# Patient Record
Sex: Male | Born: 1985 | Race: Black or African American | Hispanic: No | Marital: Single | Smoking: Current every day smoker
Health system: Southern US, Community
[De-identification: ages and names within clinical notes are randomized; demographics above are authoritative.]

## PROBLEM LIST (undated history)

## (undated) DIAGNOSIS — J45909 Unspecified asthma, uncomplicated: Secondary | ICD-10-CM

---

## 2007-03-13 ENCOUNTER — Emergency Department (HOSPITAL_COMMUNITY): Admission: EM | Admit: 2007-03-13 | Discharge: 2007-03-14 | Payer: Self-pay | Admitting: Emergency Medicine
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
## 2011-04-30 LAB — RAPID URINE DRUG SCREEN, HOSP PERFORMED
Amphetamines: NOT DETECTED
Cocaine: POSITIVE — AB
Opiates: NOT DETECTED
Tetrahydrocannabinol: NOT DETECTED

## 2011-04-30 LAB — I-STAT 8, (EC8 V) (CONVERTED LAB)
BUN: 11
Bicarbonate: 28.7 — ABNORMAL HIGH
Glucose, Bld: 91
Operator id: 151321
pCO2, Ven: 49.5

## 2011-04-30 LAB — DIFFERENTIAL
Basophils Absolute: 0
Basophils Relative: 0
Eosinophils Relative: 6 — ABNORMAL HIGH
Monocytes Absolute: 0.8 — ABNORMAL HIGH
Neutro Abs: 4.1

## 2011-04-30 LAB — CBC
Hemoglobin: 14.7
MCHC: 34.2
RDW: 13

## 2019-08-02 ENCOUNTER — Ambulatory Visit: Payer: Self-pay | Attending: Internal Medicine

## 2019-08-02 ENCOUNTER — Other Ambulatory Visit: Payer: Self-pay

## 2019-08-02 DIAGNOSIS — Z20822 Contact with and (suspected) exposure to covid-19: Secondary | ICD-10-CM | POA: Insufficient documentation

## 2019-08-03 LAB — NOVEL CORONAVIRUS, NAA: SARS-CoV-2, NAA: NOT DETECTED

## 2019-08-07 ENCOUNTER — Telehealth: Payer: Self-pay | Admitting: General Practice

## 2019-08-07 NOTE — Telephone Encounter (Signed)
Pt calling to get COVID results.  Made him aware they are negative.

## 2019-12-25 ENCOUNTER — Encounter (HOSPITAL_COMMUNITY): Payer: Self-pay

## 2019-12-25 ENCOUNTER — Emergency Department (HOSPITAL_COMMUNITY)
Admission: EM | Admit: 2019-12-25 | Discharge: 2019-12-25 | Disposition: A | Discharge status: Home / Self Care | Payer: Self-pay | Attending: Emergency Medicine | Admitting: Emergency Medicine

## 2019-12-25 ENCOUNTER — Other Ambulatory Visit: Payer: Self-pay

## 2019-12-25 DIAGNOSIS — Y9289 Other specified places as the place of occurrence of the external cause: Secondary | ICD-10-CM | POA: Insufficient documentation

## 2019-12-25 DIAGNOSIS — Y9389 Activity, other specified: Secondary | ICD-10-CM | POA: Insufficient documentation

## 2019-12-25 DIAGNOSIS — Y999 Unspecified external cause status: Secondary | ICD-10-CM | POA: Insufficient documentation

## 2019-12-25 DIAGNOSIS — S6991XA Unspecified injury of right wrist, hand and finger(s), initial encounter: Secondary | ICD-10-CM | POA: Insufficient documentation

## 2019-12-25 DIAGNOSIS — Z5321 Procedure and treatment not carried out due to patient leaving prior to being seen by health care provider: Secondary | ICD-10-CM | POA: Insufficient documentation

## 2019-12-25 DIAGNOSIS — S6990XA Unspecified injury of unspecified wrist, hand and finger(s), initial encounter: Secondary | ICD-10-CM

## 2019-12-25 DIAGNOSIS — W28XXXA Contact with powered lawn mower, initial encounter: Secondary | ICD-10-CM | POA: Insufficient documentation
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
## 2019-12-25 NOTE — ED Triage Notes (Signed)
Pt reports was loading a lawnmower and the ramp slipped and lawnmower came down on pt's r hand.  Pt appears to have an open fracture to r little finger and laceration to r ring finger.  Bleeding controlled.

## 2021-04-07 ENCOUNTER — Emergency Department (HOSPITAL_COMMUNITY)
Admission: EM | Admit: 2021-04-07 | Discharge: 2021-04-07 | Disposition: A | Discharge status: Home / Self Care | Payer: Self-pay | Attending: Emergency Medicine | Admitting: Emergency Medicine

## 2021-04-07 ENCOUNTER — Emergency Department (HOSPITAL_COMMUNITY): Payer: Self-pay

## 2021-04-07 ENCOUNTER — Other Ambulatory Visit: Payer: Self-pay

## 2021-04-07 ENCOUNTER — Encounter (HOSPITAL_COMMUNITY): Payer: Self-pay | Admitting: Emergency Medicine

## 2021-04-07 DIAGNOSIS — R072 Precordial pain: Secondary | ICD-10-CM | POA: Insufficient documentation

## 2021-04-07 DIAGNOSIS — R5383 Other fatigue: Secondary | ICD-10-CM | POA: Insufficient documentation

## 2021-04-07 DIAGNOSIS — R079 Chest pain, unspecified: Secondary | ICD-10-CM

## 2021-04-07 DIAGNOSIS — R519 Headache, unspecified: Secondary | ICD-10-CM | POA: Insufficient documentation

## 2021-04-07 DIAGNOSIS — R110 Nausea: Secondary | ICD-10-CM | POA: Insufficient documentation

## 2021-04-07 LAB — HEPATIC FUNCTION PANEL
ALT: 16 U/L (ref 0–44)
AST: 21 U/L (ref 15–41)
Albumin: 4.9 g/dL (ref 3.5–5.0)
Alkaline Phosphatase: 55 U/L (ref 38–126)
Bilirubin, Direct: <0.1 mg/dL (ref 0.0–0.2)
Total Bilirubin: 0.4 mg/dL (ref 0.3–1.2)
Total Protein: 7.8 g/dL (ref 6.5–8.1)

## 2021-04-07 LAB — BASIC METABOLIC PANEL
Anion gap: 12 (ref 5–15)
BUN: 9 mg/dL (ref 6–20)
CO2: 25 mmol/L (ref 22–32)
Calcium: 8.9 mg/dL (ref 8.9–10.3)
Chloride: 102 mmol/L (ref 98–111)
Creatinine, Ser: 0.87 mg/dL (ref 0.61–1.24)
GFR, Estimated: >60 mL/min (ref >60)
Glucose, Bld: 97 mg/dL (ref 70–99)
Potassium: 4.1 mmol/L (ref 3.5–5.1)
Sodium: 139 mmol/L (ref 135–145)

## 2021-04-07 LAB — URINALYSIS, ROUTINE W REFLEX MICROSCOPIC
Bilirubin Urine: NEGATIVE
Glucose, UA: NEGATIVE mg/dL
Hgb urine dipstick: NEGATIVE
Ketones, ur: NEGATIVE mg/dL
Leukocytes,Ua: NEGATIVE
Nitrite: NEGATIVE
Protein, ur: NEGATIVE mg/dL
Specific Gravity, Urine: <1.005 — ABNORMAL LOW (ref 1.005–1.030)
pH: 6 (ref 5.0–8.0)

## 2021-04-07 LAB — TROPONIN I (HIGH SENSITIVITY)
Troponin I (High Sensitivity): <2 ng/L (ref <18)
Troponin I (High Sensitivity): 2 ng/L (ref <18)

## 2021-04-07 LAB — CBC
HCT: 46 % (ref 39.0–52.0)
Hemoglobin: 15 g/dL (ref 13.0–17.0)
MCH: 28.2 pg (ref 26.0–34.0)
MCHC: 32.6 g/dL (ref 30.0–36.0)
MCV: 86.6 fL (ref 80.0–100.0)
Platelets: 394 10*3/uL (ref 150–400)
RBC: 5.31 MIL/uL (ref 4.22–5.81)
RDW: 13.3 % (ref 11.5–15.5)
WBC: 10.7 10*3/uL — ABNORMAL HIGH (ref 4.0–10.5)
nRBC: 0 % (ref 0.0–0.2)

## 2021-04-07 LAB — CK: Total CK: 417 U/L — ABNORMAL HIGH (ref 49–397)

## 2021-04-07 MED ORDER — IBUPROFEN 200 MG PO TABS
600.0000 mg | ORAL_TABLET | Freq: Once | ORAL | Status: AC
  Administered 2021-04-07: 600 mg via ORAL
  Filled 2021-04-07: qty 3

## 2021-04-07 MED ORDER — DIPHENHYDRAMINE HCL 25 MG PO CAPS
25.0000 mg | ORAL_CAPSULE | Freq: Once | ORAL | Status: AC
  Administered 2021-04-07: 25 mg via ORAL
  Filled 2021-04-07: qty 1

## 2021-04-07 MED ORDER — IOHEXOL 350 MG/ML SOLN
80.0000 mL | Freq: Once | INTRAVENOUS | Status: AC | PRN
  Administered 2021-04-07: 80 mL via INTRAVENOUS

## 2021-04-07 MED ORDER — LACTATED RINGERS IV BOLUS
1000.0000 mL | Freq: Once | INTRAVENOUS | Status: AC
  Administered 2021-04-07: 1000 mL via INTRAVENOUS

## 2021-04-07 MED ORDER — MORPHINE SULFATE (PF) 4 MG/ML IV SOLN
4.0000 mg | Freq: Once | INTRAVENOUS | Status: AC
  Administered 2021-04-07: 4 mg via INTRAVENOUS
  Filled 2021-04-07: qty 1

## 2021-04-07 NOTE — ED Notes (Signed)
Started oral challenge with juice, crackers, and applesauce.

## 2021-04-07 NOTE — ED Notes (Signed)
No answer

## 2021-04-07 NOTE — ED Triage Notes (Signed)
Patient drunk alcohol tonight and ate fish sticks. He is complaining of chest pain. Patient also chased all of this with Pepto bismol.

## 2021-04-07 NOTE — ED Notes (Signed)
Pt states he will provide a urine sample when he returns from imaging.

## 2021-04-07 NOTE — ED Provider Notes (Signed)
[facility]-EMERGENCY DEPT Provider Note   CSN: [id number] Arrival date & time: 04/07/21  1884     History Chief Complaint  Patient presents with   Chest Pain    Gerald Frank is a 35 y.o. male.   Chest Pain Associated symptoms: fatigue, headache and nausea   Associated symptoms: no abdominal pain, no back pain, no cough, no dizziness, no fever, no numbness, no palpitations, no shortness of breath, no vomiting and no weakness   Patient presents for full body aches and chest pain.  He denies any chronic medical conditions.  He was in his normal state of health last night.  Around midnight, he was at home and ate frozen fish sticks and drank a beer.  He reports that shortly after this, he developed diffuse body pains.  He endorses substernal chest pain with pleurisy.  He does report the pain radiates into the right side of his neck.  He has felt nausea.  He continued to have the symptoms throughout the night.  He took a Tylenol arthritis over-the-counter medication with minimal relief.  He denies any history of similar symptoms in the past.  He has no known allergies.  He is not aware of any other exposures he may have had.  He does not have a personal or family history of early ACS. HPI: A 35 year old patient presents for evaluation of chest pain. Initial onset of pain was less than one hour ago. The patient's chest pain is described as heaviness/pressure/tightness and is not worse with exertion. The patient complains of nausea. The patient's chest pain is middle- or left-sided, is not well-localized, is not sharp and does radiate to the arms/jaw/neck. The patient denies diaphoresis. The patient has smoked in the past 90 days. The patient has no history of stroke, has no history of peripheral artery disease, denies any history of treated diabetes, has no relevant family history of coronary artery disease (first degree relative at less than age 29), is not hypertensive, has no  history of hypercholesterolemia and does not have an elevated BMI (>=30).   History reviewed. No pertinent past medical history.  There are no problems to display for this patient.   History reviewed. No pertinent surgical history.     History reviewed. No pertinent family history.  Social History   Tobacco Use   Smoking status: Never   Smokeless tobacco: Never  Substance Use Topics   Alcohol use: Yes   Drug use: Never    Home Medications Prior to Admission medications   Not on File    Allergies    Patient has no known allergies.  Review of Systems   Review of Systems  Constitutional:  Positive for fatigue. Negative for activity change, appetite change, chills and fever.  HENT:  Negative for congestion, ear pain and sore throat.   Eyes:  Negative for pain and visual disturbance.  Respiratory:  Positive for chest tightness. Negative for cough and shortness of breath.   Cardiovascular:  Positive for chest pain. Negative for palpitations.  Gastrointestinal:  Positive for nausea. Negative for abdominal pain and vomiting.  Genitourinary:  Negative for dysuria, flank pain and hematuria.  Musculoskeletal:  Positive for joint swelling and myalgias. Negative for arthralgias, back pain and neck stiffness.  Skin:  Negative for color change and rash.  Neurological:  Positive for headaches. Negative for dizziness, seizures, syncope, weakness and numbness.  All other systems reviewed and are negative.  Physical Exam Updated Vital Signs BP (!) 161/94  Pulse (!) 58   Temp 97.6 F (36.4 C) (Oral)   Resp 18   Ht 6\' 1"  (1.854 m)   Wt 68 kg   SpO2 100%   BMI 19.79 kg/m   Physical Exam Vitals and nursing note reviewed.  Constitutional:      General: He is not in acute distress.    Appearance: He is well-developed and normal weight. He is not ill-appearing, toxic-appearing or diaphoretic.  HENT:     Head: Normocephalic and atraumatic.  Eyes:     Extraocular Movements:  Extraocular movements intact.     Conjunctiva/sclera: Conjunctivae normal.  Neck:     Vascular: No JVD.  Cardiovascular:     Rate and Rhythm: Normal rate and regular rhythm.     Heart sounds: No murmur heard. Pulmonary:     Effort: Pulmonary effort is normal. No tachypnea or respiratory distress.     Breath sounds: Normal breath sounds. No decreased breath sounds or wheezing.  Chest:     Chest wall: Tenderness present.  Abdominal:     Palpations: Abdomen is soft.     Tenderness: There is no abdominal tenderness.  Musculoskeletal:     Cervical back: Neck supple.     Right lower leg: No edema.     Left lower leg: No edema.  Skin:    General: Skin is warm and dry.     Capillary Refill: Capillary refill takes less than 2 seconds.  Neurological:     General: No focal deficit present.     Mental Status: He is alert and oriented to person, place, and time.     Cranial Nerves: No cranial nerve deficit.     Motor: No weakness.  Psychiatric:        Mood and Affect: Mood normal.        Behavior: Behavior normal.    ED Results / Procedures / Treatments   Labs (all labs ordered are listed, but only abnormal results are displayed) Labs Reviewed  CBC - Abnormal; Notable for the following components:      Result Value   WBC 10.7 (*)    All other components within normal limits  CK - Abnormal; Notable for the following components:   Total CK 417 (*)    All other components within normal limits  URINALYSIS, ROUTINE W REFLEX MICROSCOPIC - Abnormal; Notable for the following components:   Specific Gravity, Urine <1.005 (*)    All other components within normal limits  BASIC METABOLIC PANEL  HEPATIC FUNCTION PANEL  TROPONIN I (HIGH SENSITIVITY)  TROPONIN I (HIGH SENSITIVITY)    EKG EKG Interpretation  Date/Time:  Tuesday April 07 2021 02:29:01 EDT Ventricular Rate:  84 PR Interval:  168 QRS Duration: 83 QT Interval:  375 QTC Calculation: 444 R Axis:   93 Text  Interpretation: Sinus rhythm Anterior infarct, old ST elevation, consider inferior injury Confirmed by 11[birth year]18-1978 [phone]) on 04/07/2021 8:00:44 AM  Radiology DG Chest 2 View  Result Date: 04/07/2021 CLINICAL DATA:  Chest pain. EXAM: CHEST - 2 VIEW COMPARISON:  Chest radiograph dated 03/13/2007. FINDINGS: The heart size and mediastinal contours are within normal limits. Both lungs are clear. The visualized skeletal structures are unremarkable. IMPRESSION: No active cardiopulmonary disease. Electronically Signed   By: 03/15/2007 M.D.   On: 04/07/2021 02:38   CT Angio Chest PE W and/or Wo Contrast  Result Date: 04/07/2021 CLINICAL DATA:  Sudden onset of chest pain yesterday. Evaluate for pulmonary embolism. EXAM: CT ANGIOGRAPHY CHEST  WITH CONTRAST TECHNIQUE: Multidetector CT imaging of the chest was performed using the standard protocol during bolus administration of intravenous contrast. Multiplanar CT image reconstructions and MIPs were obtained to evaluate the vascular anatomy. CONTRAST:  94mL OMNIPAQUE IOHEXOL 350 MG/ML SOLN COMPARISON:  None. FINDINGS: Vascular Findings: There is adequate opacification of the pulmonary arterial system with the main pulmonary artery measuring 408 Hounsfield units. There are no discrete filling defects within the pulmonary arterial tree to suggest pulmonary embolism. Normal caliber of the main pulmonary artery. Normal heart size.  No pericardial effusion. No evidence of thoracic aortic aneurysm or dissection on this nongated examination. Conventional configuration of the aortic arch. The branch vessels of the aortic arch appear patent throughout their imaged courses. Incidental note made of an aortic nipple. Review of the MIP images confirms the above findings. ---------------------------------------------------------------------------------- Nonvascular Findings: Mediastinum/Lymph Nodes: There is a minimal amount of ill-defined soft tissue within the anterior  mediastinum without associated mass effect, favored to represent residual thymic tissue. No bulky mediastinal, hilar or axillary lymphadenopathy. Lungs/Pleura: Mild apical predominant paraseptal emphysematous change, most conspicuous greater involving the medial aspect of the bilateral lung apices. Minimal bibasilar dependent subpleural ground-glass atelectasis. No discrete focal airspace opacities. No air bronchograms. No pleural effusion or pneumothorax. The central pulmonary airways appear widely patent. No discrete pulmonary nodules. Upper abdomen: Limited visualization of the upper abdomen demonstrates early excretion of contrast within the imaged portions of the bilateral renal collecting systems. Questionable mild circumferential wall thickening of the esophagus. Musculoskeletal: No acute or aggressive osseous abnormalities. Mild asymmetric left-sided gynecomastia. Normal appearance of the thyroid gland. IMPRESSION: 1. No acute cardiopulmonary disease. Specifically, no evidence of pulmonary embolism. 2. Minimal age advanced biapical paraseptal emphysematous change. 3. Potential circumferential wall thickening of the esophagus, nonspecific though could be seen in the setting of an esophagitis. Clinical correlation is advised. Electronically Signed   By: Simonne Come M.D.   On: 04/07/2021 09:50    Procedures Procedures   Medications Ordered in ED Medications  ibuprofen (ADVIL) tablet 600 mg (600 mg Oral Given 04/07/21 0847)  diphenhydrAMINE (BENADRYL) capsule 25 mg (25 mg Oral Given 04/07/21 0847)  lactated ringers bolus 1,000 mL (1,000 mLs Intravenous New Bag/Given 04/07/21 0853)  morphine 4 MG/ML injection 4 mg (4 mg Intravenous Given 04/07/21 0849)  iohexol (OMNIPAQUE) 350 MG/ML injection 80 mL (80 mLs Intravenous Contrast Given 04/07/21 0916)    ED Course  I have reviewed the triage vital signs and the nursing notes.  Pertinent labs & imaging results that were available during my care of the  patient were reviewed by me and considered in my medical decision making (see chart for details).    MDM Rules/Calculators/A&P HEAR Score: 4                         Patient is a 35 year old male with no past medical history presenting for the cute onset of full body pains described as in his muscles and joints.  He reports that his pain is most prominent in the substernal area of his chest.  Prior to being bedded in the ED, diagnostic work-up was initiated.  EKG shows anterior Q waves.  Initial troponin was normal.  Based on symptoms, EKG, and risk factors, patient is a heart score of 4.  Additional labs show normal electrolytes, normal creatinine, normal hemoglobin with a very mild leukocytosis.  On initial assessment, patient still very symptomatic.  Lungs appear to auscultation.  No cardiac  murmurs appreciated.  He has mild tenderness diffusely.  He has no focal neurologic deficits.  Additional lab work, LFTs, urinalysis, and CK were ordered.  Patient was given IV fluids and medication for symptomatic relief.  A CT scan of chest was ordered which did not show any PE.  It did show findings that could possibly be consistent with esophagitis.  It is unclear why this patient would have esophagitis.  Additional findings on work-up showed a mildly elevated CK.  Patient did not have any evidence of myoglobinuria.  Again, because of elevated CK is indeterminate at this time.  On further reassessment patient reported resolution of symptoms.  He was able to eat and drink in the ED.  He was informed of diagnostic work-up findings.  At this point, he did feel comfortable with discharge home.  He was advised to establish a primary care doctor for follow-up and to return to the ED for any recurrence of symptoms.  He was discharged in good condition. Final Clinical Impression(s) / ED Diagnoses Final diagnoses:  Chest pain, unspecified type    Rx / DC Orders ED Discharge Orders     None        Gloris Manchester,  MD 04/07/21 1053

## 2023-01-05 IMAGING — CR DG CHEST 2V
2 series · 2 of 2 positions shown · non-contrast
Comparison: Chest radiograph dated 03/13/2007.

CLINICAL DATA: Chest pain.

EXAM:
CHEST - 2 VIEW

[w chest pa]
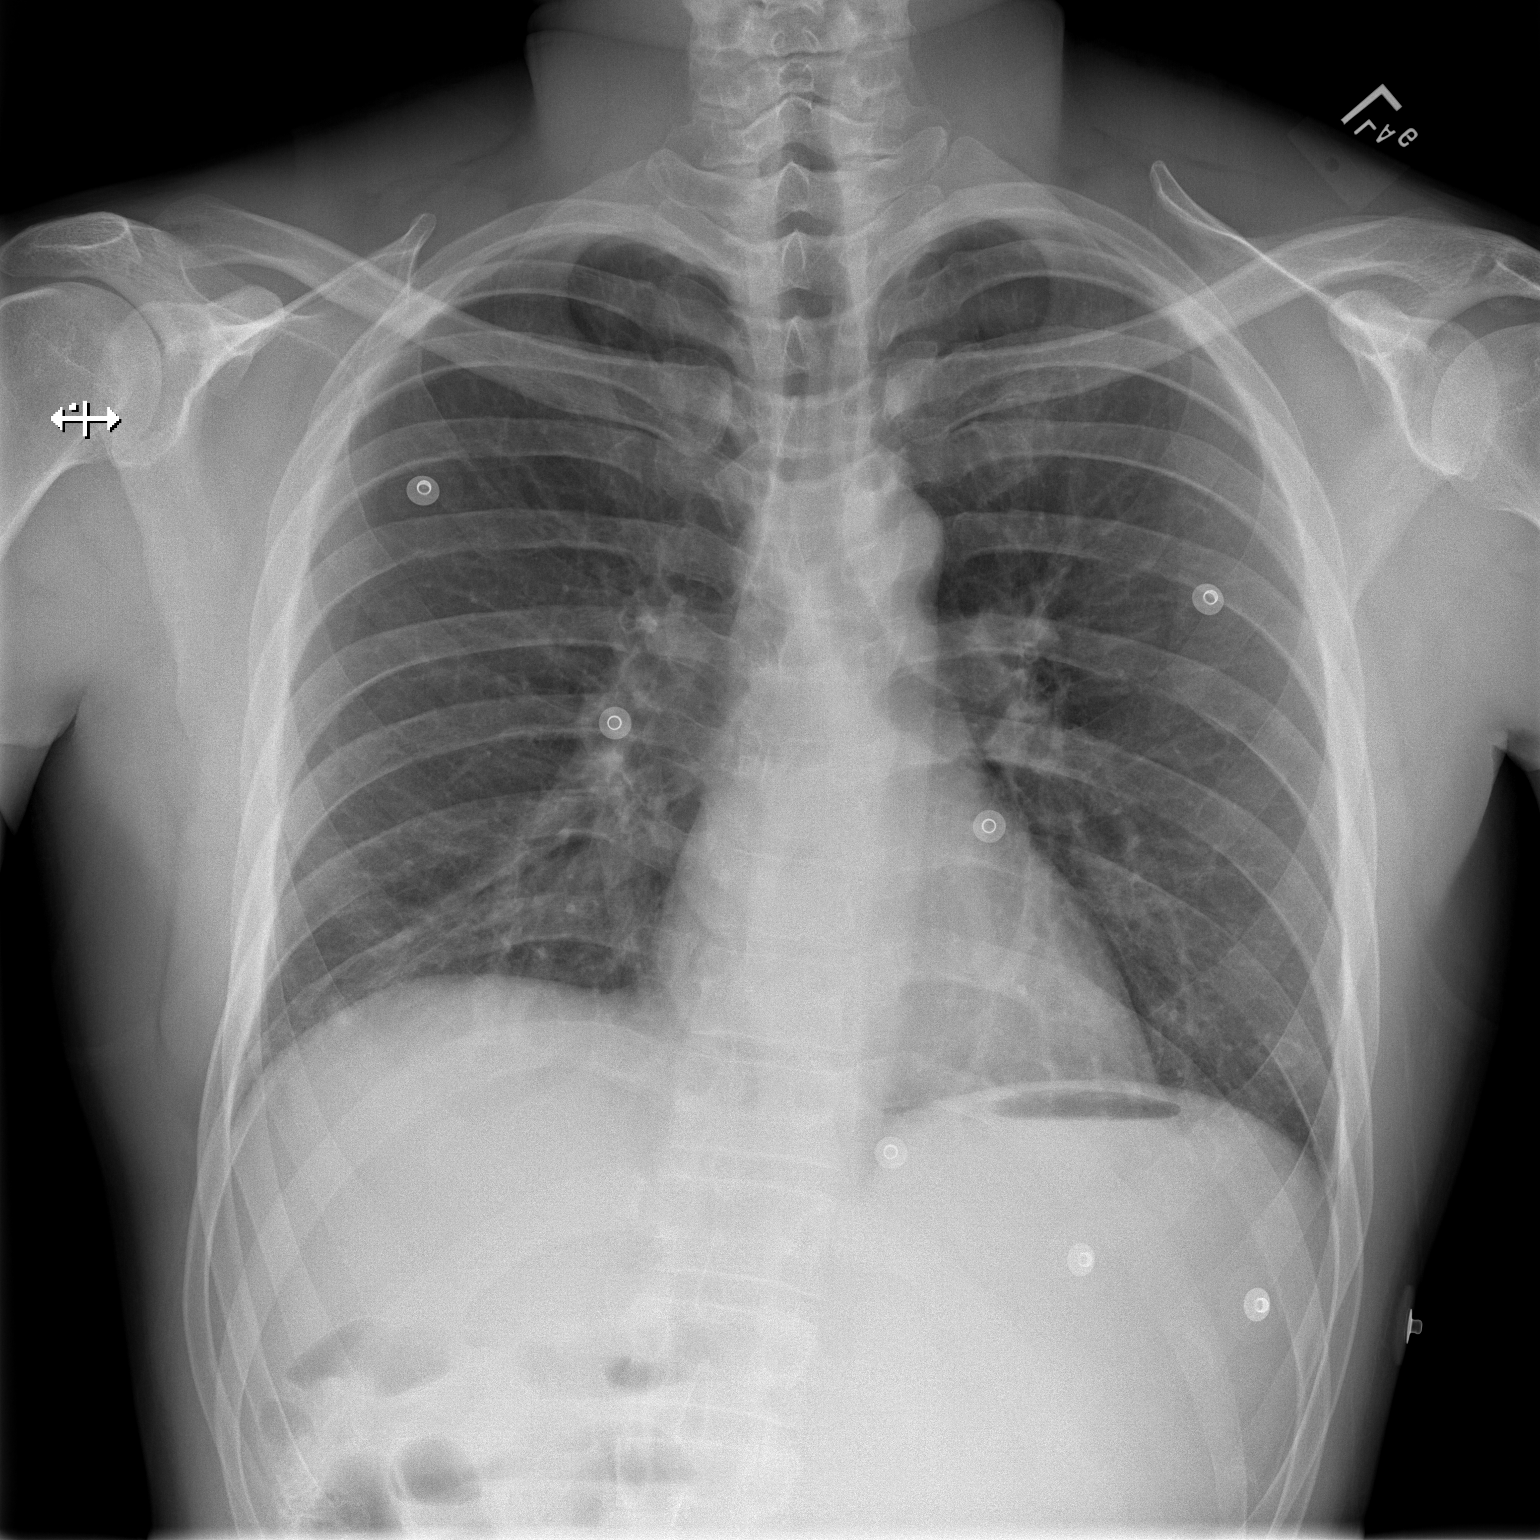

[w chest lat]
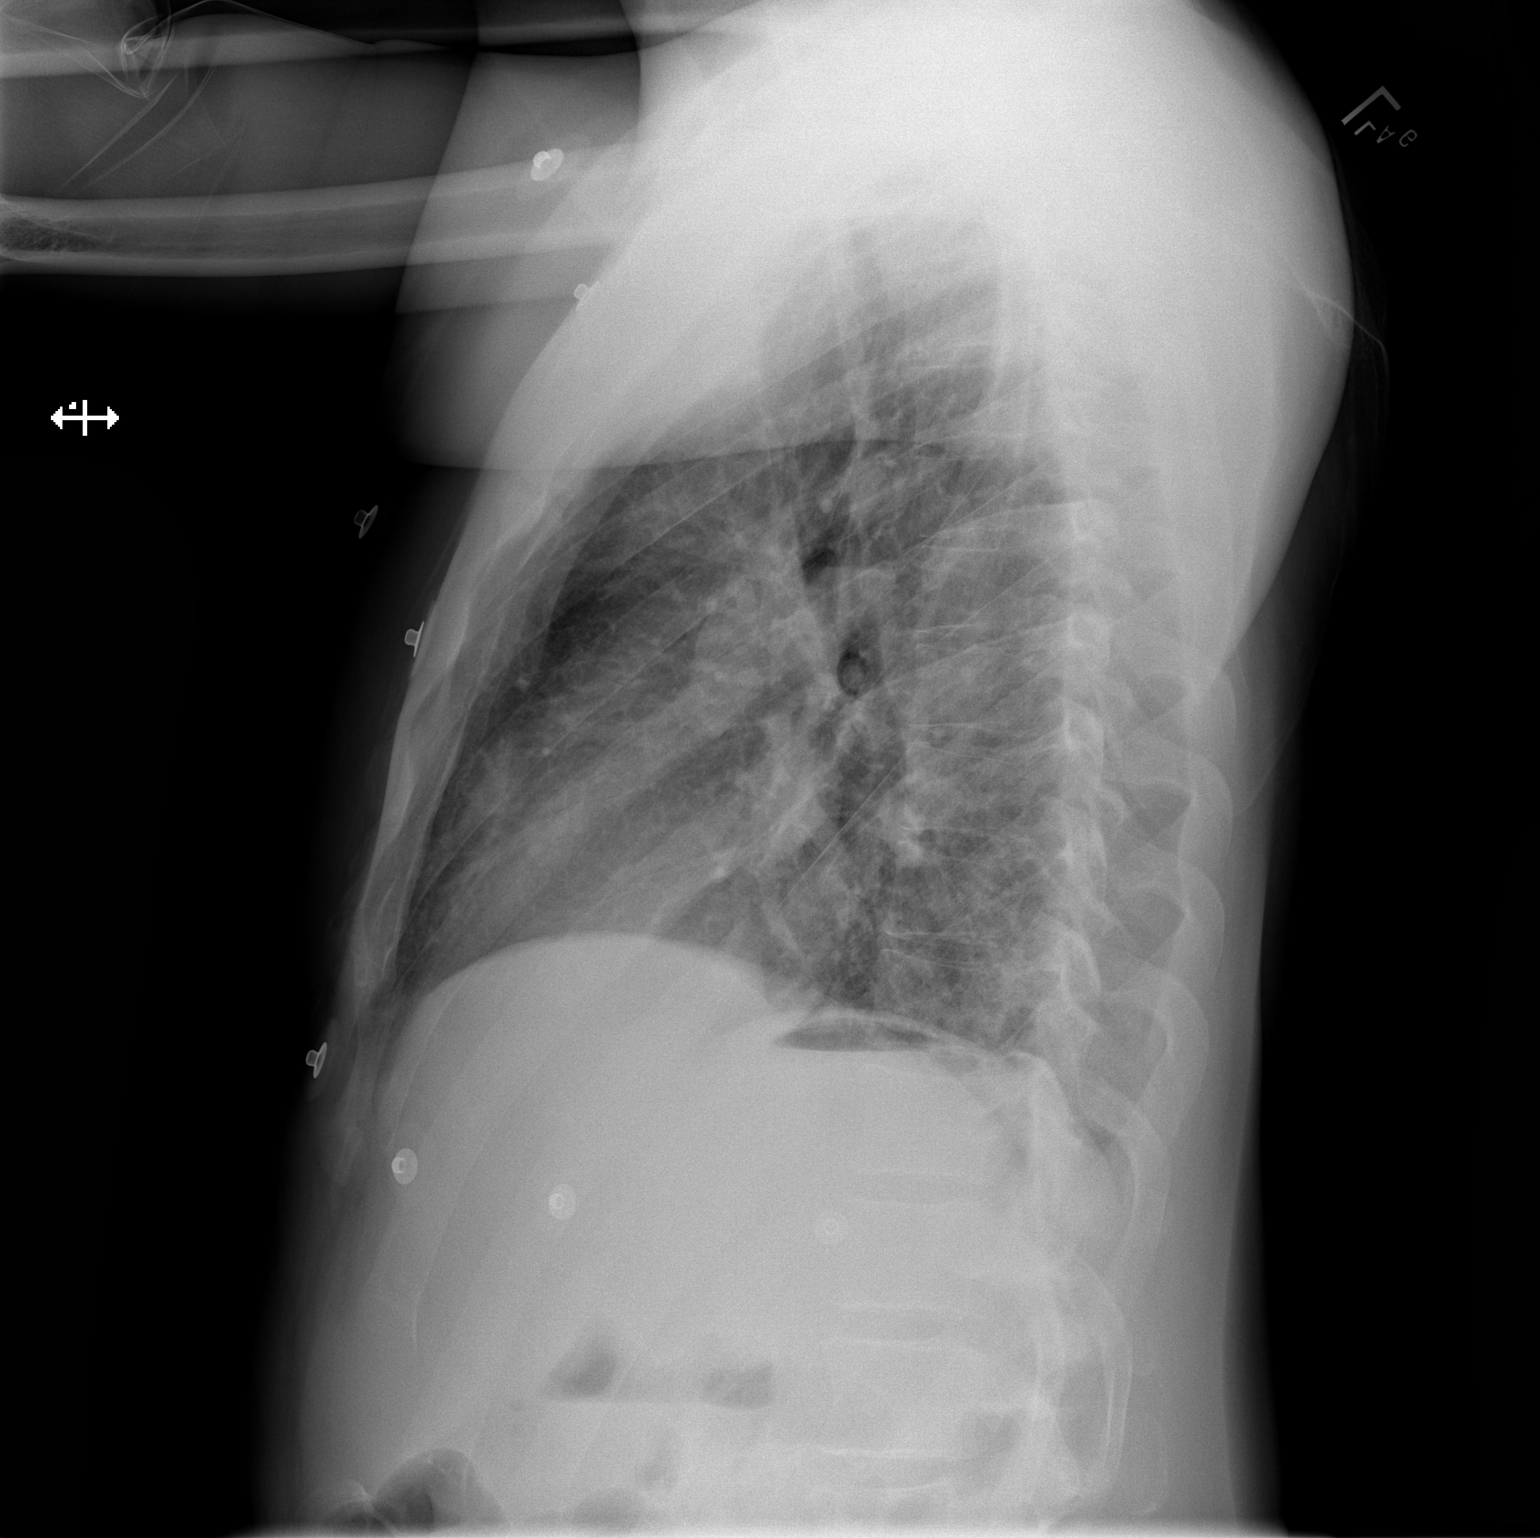

[2 of 2 positions shown; findings below may reference images not displayed]

FINDINGS: The heart size and mediastinal contours are within normal limits.
Both lungs are clear. The visualized skeletal structures are
unremarkable.
IMPRESSION: No active cardiopulmonary disease.

## 2023-01-05 IMAGING — CT CT ANGIO CHEST
2 of 7 series · 17 of 46 positions shown · IV contrast (APPLIED)
Comparison: None.

CLINICAL DATA: Sudden onset of chest pain yesterday. Evaluate for
pulmonary embolism.

EXAM:
CT ANGIOGRAPHY CHEST WITH CONTRAST
TECHNIQUE: Multidetector CT imaging of the chest was performed using the
standard protocol during bolus administration of intravenous
contrast. Multiplanar CT image reconstructions and MIPs were
obtained to evaluate the vascular anatomy.
CONTRAST:  80mL OMNIPAQUE IOHEXOL 350 MG/ML SOLN

[Series 5: thins · axial · 0.80mm/px · z∈[+1319,+1580]mm · 15 of 299 slices shown]
[im 19/299  lung]
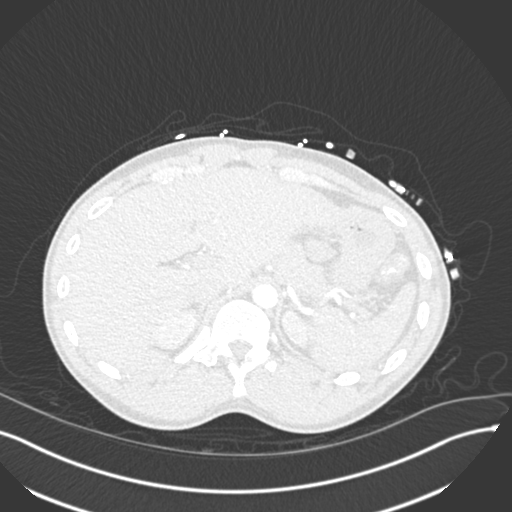
[im 38/299  soft-tissue]
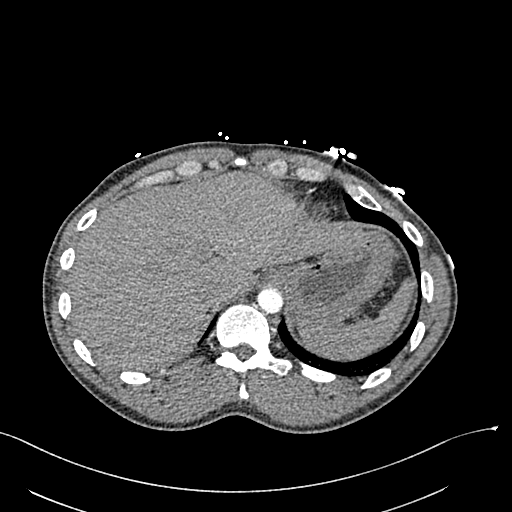
[im 56/299  lung]
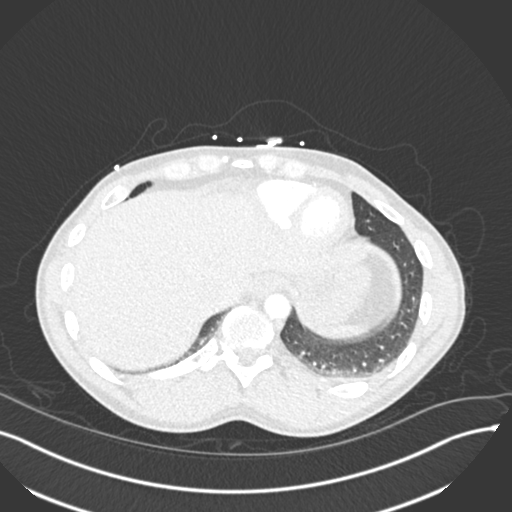
[im 75/299  soft-tissue]
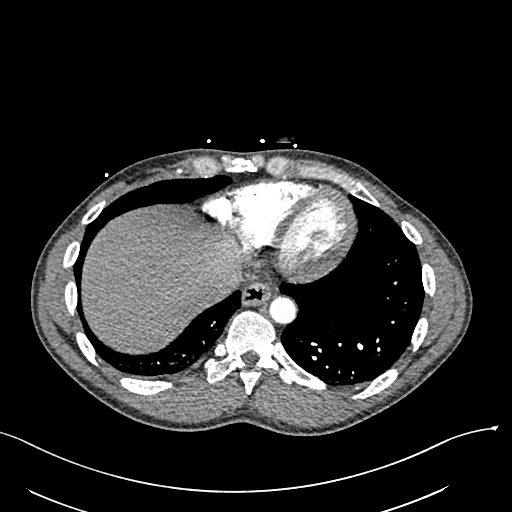
[im 94/299  lung]
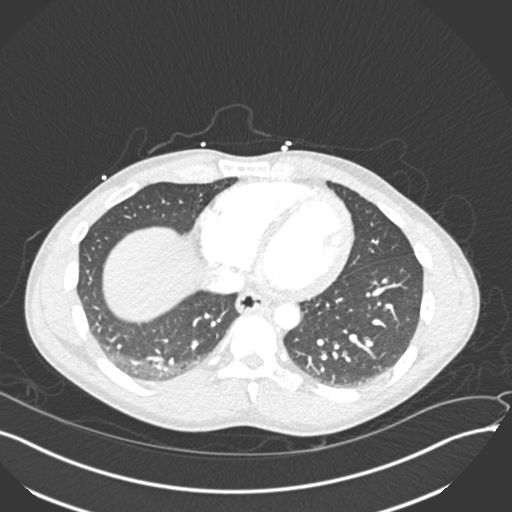
[im 112/299  soft-tissue]
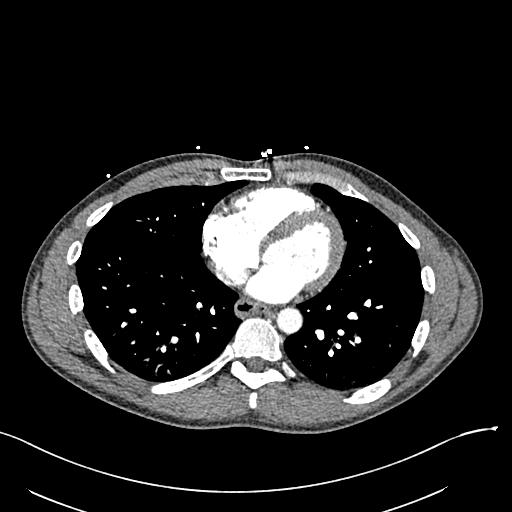
[im 131/299  lung]
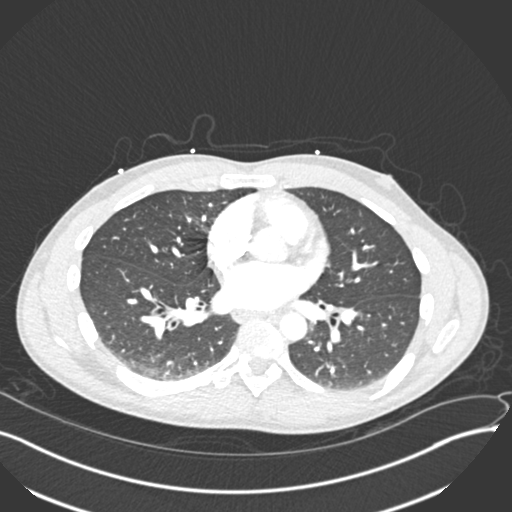
[im 150/299  soft-tissue]
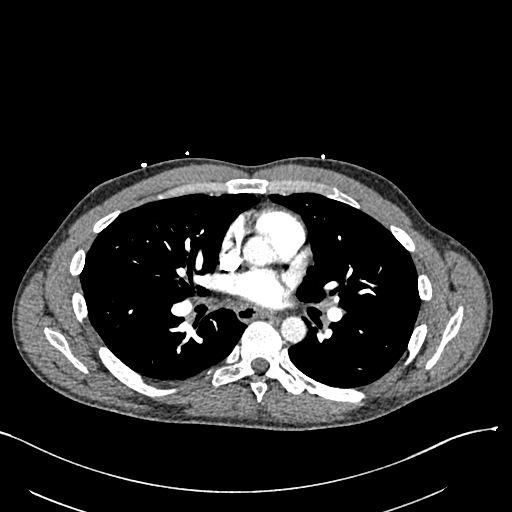
[im 168/299  lung]
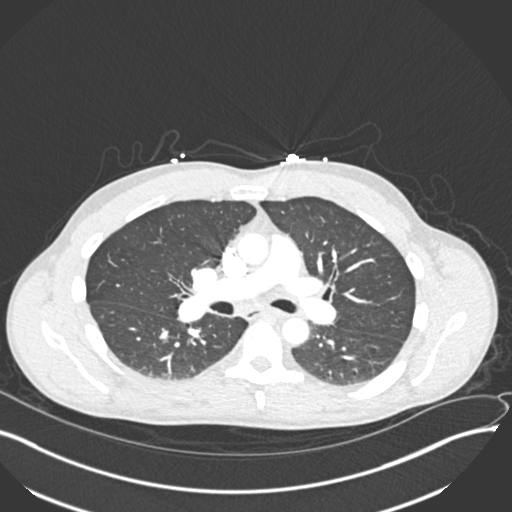
[im 187/299  soft-tissue]
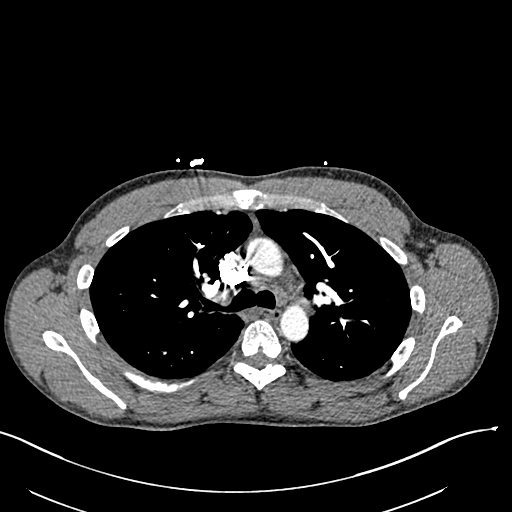
[im 205/299  lung]
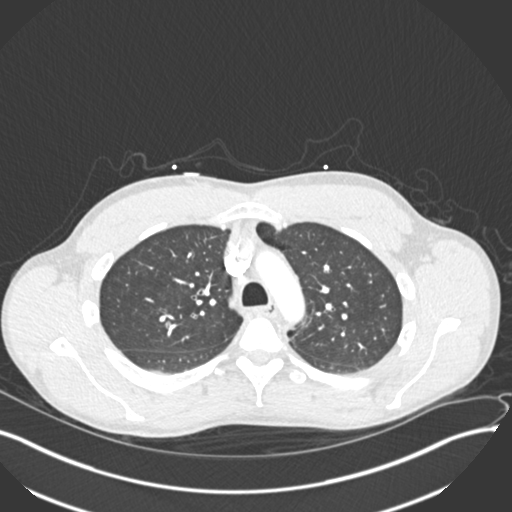
[im 224/299  soft-tissue]
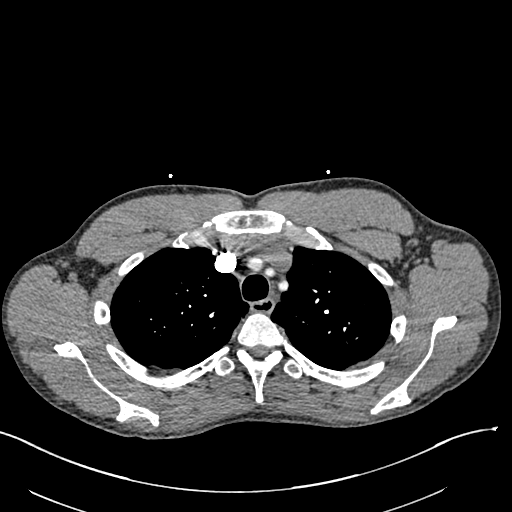
[im 243/299  lung]
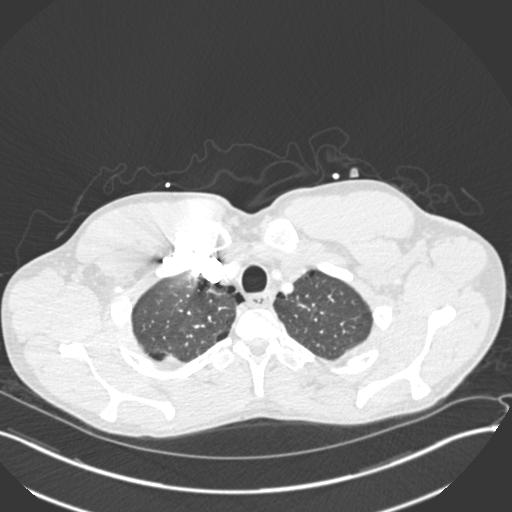
[im 261/299  soft-tissue]
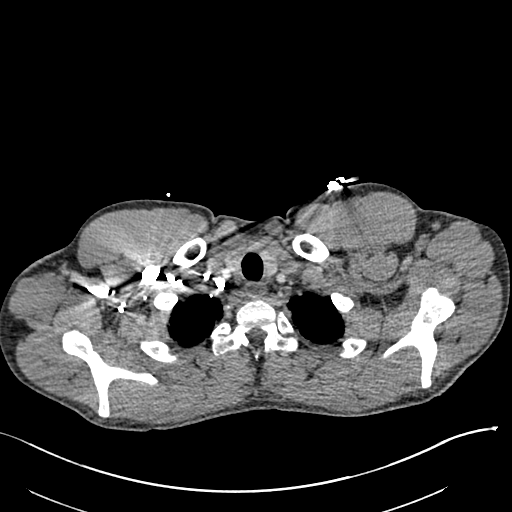
[im 280/299  lung]
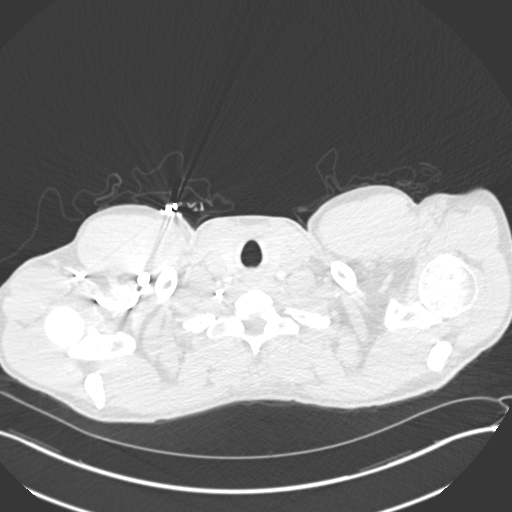

[Series 6: coronal mpr · coronal · 0.59mm/px · 2 of 79 slices shown]
[im 27/79  soft-tissue]
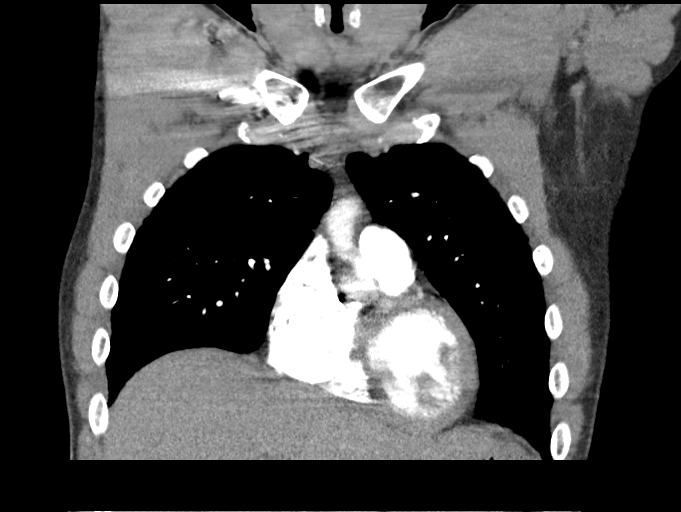
[im 53/79  soft-tissue]
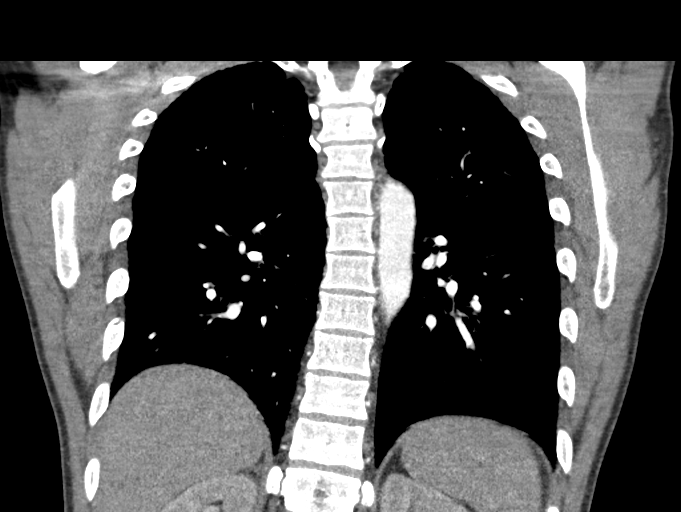

[17 of 46 positions shown; findings below may reference images not displayed]

FINDINGS: Vascular Findings:

There is adequate opacification of the pulmonary arterial system
with the main pulmonary artery measuring 408 Hounsfield units. There
are no discrete filling defects within the pulmonary arterial tree
to suggest pulmonary embolism. Normal caliber of the main pulmonary
artery.

Normal heart size.  No pericardial effusion.

No evidence of thoracic aortic aneurysm or dissection on this
nongated examination.

Conventional configuration of the aortic arch. The branch vessels of
the aortic arch appear patent throughout their imaged courses.
Incidental note made of an aortic nipple.

Review of the MIP images confirms the above findings.

----------------------------------------------------------------------------------

Nonvascular Findings:

Mediastinum/Lymph Nodes: There is a minimal amount of ill-defined
soft tissue within the anterior mediastinum without associated mass
effect, favored to represent residual thymic tissue. No bulky
mediastinal, hilar or axillary lymphadenopathy.

Lungs/Pleura: Mild apical predominant paraseptal emphysematous
change, most conspicuous greater involving the medial aspect of the
bilateral lung apices. Minimal bibasilar dependent subpleural
ground-glass atelectasis. No discrete focal airspace opacities. No
air bronchograms. No pleural effusion or pneumothorax. The central
pulmonary airways appear widely patent.

No discrete pulmonary nodules.

Upper abdomen: Limited visualization of the upper abdomen
demonstrates early excretion of contrast within the imaged portions
of the bilateral renal collecting systems. Questionable mild
circumferential wall thickening of the esophagus.

Musculoskeletal: No acute or aggressive osseous abnormalities. Mild
asymmetric left-sided gynecomastia. Normal appearance of the thyroid
gland.
IMPRESSION: 1. No acute cardiopulmonary disease. Specifically, no evidence of
pulmonary embolism.
2. Minimal age advanced biapical paraseptal emphysematous change.
3. Potential circumferential wall thickening of the esophagus,
nonspecific though could be seen in the setting of an esophagitis.
Clinical correlation is advised.

## 2023-04-29 ENCOUNTER — Emergency Department (HOSPITAL_COMMUNITY): Payer: Self-pay

## 2023-04-29 ENCOUNTER — Emergency Department (HOSPITAL_COMMUNITY)
Admission: EM | Admit: 2023-04-29 | Discharge: 2023-04-29 | Disposition: A | Discharge status: Home / Self Care | Payer: Self-pay | Attending: Emergency Medicine | Admitting: Emergency Medicine

## 2023-04-29 ENCOUNTER — Other Ambulatory Visit: Payer: Self-pay

## 2023-04-29 ENCOUNTER — Encounter (HOSPITAL_COMMUNITY): Payer: Self-pay | Admitting: Emergency Medicine

## 2023-04-29 DIAGNOSIS — Z20822 Contact with and (suspected) exposure to covid-19: Secondary | ICD-10-CM | POA: Insufficient documentation

## 2023-04-29 DIAGNOSIS — J45901 Unspecified asthma with (acute) exacerbation: Secondary | ICD-10-CM | POA: Insufficient documentation

## 2023-04-29 DIAGNOSIS — Z7951 Long term (current) use of inhaled steroids: Secondary | ICD-10-CM | POA: Insufficient documentation

## 2023-04-29 DIAGNOSIS — F17200 Nicotine dependence, unspecified, uncomplicated: Secondary | ICD-10-CM | POA: Insufficient documentation

## 2023-04-29 HISTORY — DX: Unspecified asthma, uncomplicated: J45.909

## 2023-04-29 LAB — CBC WITH DIFFERENTIAL/PLATELET
Abs Immature Granulocytes: 0.02 10*3/uL (ref 0.00–0.07)
Basophils Absolute: 0.1 10*3/uL (ref 0.0–0.1)
Basophils Relative: 1 %
Eosinophils Absolute: 0.8 10*3/uL — ABNORMAL HIGH (ref 0.0–0.5)
Eosinophils Relative: 11 %
HCT: 49.8 % (ref 39.0–52.0)
Hemoglobin: 17.1 g/dL — ABNORMAL HIGH (ref 13.0–17.0)
Immature Granulocytes: 0 %
Lymphocytes Relative: 37 %
Lymphs Abs: 2.6 10*3/uL (ref 0.7–4.0)
MCH: 28.9 pg (ref 26.0–34.0)
MCHC: 34.3 g/dL (ref 30.0–36.0)
MCV: 84.3 fL (ref 80.0–100.0)
Monocytes Absolute: 0.4 10*3/uL (ref 0.1–1.0)
Monocytes Relative: 6 %
Neutro Abs: 3.1 10*3/uL (ref 1.7–7.7)
Neutrophils Relative %: 45 %
Platelets: 311 10*3/uL (ref 150–400)
RBC: 5.91 MIL/uL — ABNORMAL HIGH (ref 4.22–5.81)
RDW: 13 % (ref 11.5–15.5)
WBC: 7.1 10*3/uL (ref 4.0–10.5)
nRBC: 0 % (ref 0.0–0.2)

## 2023-04-29 LAB — BASIC METABOLIC PANEL
Anion gap: 9 (ref 5–15)
BUN: 17 mg/dL (ref 6–20)
CO2: 26 mmol/L (ref 22–32)
Calcium: 9 mg/dL (ref 8.9–10.3)
Chloride: 100 mmol/L (ref 98–111)
Creatinine, Ser: 0.97 mg/dL (ref 0.61–1.24)
GFR, Estimated: >60 mL/min (ref >60)
Glucose, Bld: 116 mg/dL — ABNORMAL HIGH (ref 70–99)
Potassium: 3.6 mmol/L (ref 3.5–5.1)
Sodium: 135 mmol/L (ref 135–145)

## 2023-04-29 LAB — SARS CORONAVIRUS 2 BY RT PCR: SARS Coronavirus 2 by RT PCR: NEGATIVE

## 2023-04-29 MED ORDER — IPRATROPIUM-ALBUTEROL 0.5-2.5 (3) MG/3ML IN SOLN
3.0000 mL | Freq: Once | RESPIRATORY_TRACT | Status: AC
  Administered 2023-04-29: 3 mL via RESPIRATORY_TRACT

## 2023-04-29 MED ORDER — METHYLPREDNISOLONE SODIUM SUCC 125 MG IJ SOLR
125.0000 mg | Freq: Once | INTRAMUSCULAR | Status: AC
  Administered 2023-04-29: 125 mg via INTRAVENOUS
  Filled 2023-04-29: qty 2

## 2023-04-29 MED ORDER — AEROCHAMBER PLUS FLO-VU MEDIUM MISC
1.0000 | Freq: Once | Status: AC
  Administered 2023-04-29: 1
  Filled 2023-04-29 (×2): qty 1

## 2023-04-29 MED ORDER — PREDNISONE 50 MG PO TABS: ORAL_TABLET | ORAL | 0 refills | Status: DC

## 2023-04-29 MED ORDER — ALBUTEROL SULFATE HFA 108 (90 BASE) MCG/ACT IN AERS
2.0000 | INHALATION_SPRAY | RESPIRATORY_TRACT | Status: DC | PRN
  Administered 2023-04-29: 2 via RESPIRATORY_TRACT
  Filled 2023-04-29: qty 6.7

## 2023-04-29 NOTE — ED Provider Notes (Signed)
[name] EMERGENCY DEPARTMENT AT [facility] Provider Note   CSN: [id number] Arrival date & time: 04/29/23  1338     History  Chief Complaint  Patient presents with   Shortness of Breath    Gerald Frank is a 37 y.o. male with a history of asthma and tobacco abuse, although states he has weaned himself down to 2 cigarettes/day presenting with a 1 week history of increasing wheezing associated with shortness of breath and cough which has been productive of clear to yellow sputum.  He denies fevers or chills, he does endorse chest tightness and upper anterior chest pain which is triggered by coughing.  Patient does not have a PCP nor does he have any medications for his asthma, he is currently using a generic inhaler which she does not need a prescription for, I suspect this may be a [employer] brand Primatene kind of product.  He generally uses 2 puffs daily most days, has increased over the past week with simply worsening symptoms.  EMS was called last night and gave him a breathing treatment.  He felt much improved therefore declined transportation.  He received 2 albuterol treatments and route today, his oxygen saturation on room air upon arrival was 88%.  The history is provided by the patient.       Home Medications Prior to Admission medications   Medication Sig Start Date End Date Taking? Authorizing Provider  predniSONE (DELTASONE) 50 MG tablet Take 1 tablet daily for 5 days. 04/29/23  Yes IdolRaynelle Fanning, PA-C      Allergies    Patient has no known allergies.    Review of Systems   Review of Systems  Constitutional:  Negative for chills and fever.  HENT:  Negative for congestion and sore throat.   Eyes: Negative.   Respiratory:  Positive for cough, chest tightness, shortness of breath and wheezing.   Cardiovascular:  Negative for chest pain.  Gastrointestinal:  Negative for abdominal pain, nausea and vomiting.  Genitourinary: Negative.   Musculoskeletal:  Negative  for arthralgias, joint swelling and neck pain.  Skin: Negative.  Negative for rash and wound.  Neurological:  Negative for dizziness, weakness, light-headedness, numbness and headaches.  Psychiatric/Behavioral: Negative.    All other systems reviewed and are negative.   Physical Exam Updated Vital Signs BP 107/74   Pulse 98   Temp 97.6 F (36.4 C) (Oral)   Resp 19   Ht 6\' 1"  (1.854 m)   Wt 72.6 kg   SpO2 95%   BMI 21.11 kg/m  Physical Exam Vitals and nursing note reviewed.  Constitutional:      Appearance: He is well-developed.  HENT:     Head: Normocephalic and atraumatic.     Comments: Dry tongue and buccal mucosa Eyes:     Conjunctiva/sclera: Conjunctivae normal.  Cardiovascular:     Rate and Rhythm: Normal rate and regular rhythm.     Heart sounds: Normal heart sounds.  Pulmonary:     Effort: Pulmonary effort is normal.     Breath sounds: Wheezing present. No rhonchi or rales.     Comments: Expiratory wheeze throughout all lung fields with prolonged expirations. Abdominal:     General: Bowel sounds are normal.     Palpations: Abdomen is soft.     Tenderness: There is no abdominal tenderness.  Musculoskeletal:        General: Normal range of motion.     Cervical back: Normal range of motion.     Right  lower leg: No edema.     Left lower leg: No edema.  Skin:    General: Skin is warm and dry.  Neurological:     Mental Status: He is alert.     ED Results / Procedures / Treatments   Labs (all labs ordered are listed, but only abnormal results are displayed) Labs Reviewed  CBC WITH DIFFERENTIAL/PLATELET - Abnormal; Notable for the following components:      Result Value   RBC 5.91 (*)    Hemoglobin 17.1 (*)    Eosinophils Absolute 0.8 (*)    All other components within normal limits  BASIC METABOLIC PANEL - Abnormal; Notable for the following components:   Glucose, Bld 116 (*)    All other components within normal limits  SARS CORONAVIRUS 2 BY RT PCR     EKG None ED ECG REPORT   Date: 04/29/2023  Rate: 117  Rhythm: sinus tachycardia  QRS Axis: right  Intervals: normal  ST/T Wave abnormalities: normal  Conduction Disutrbances:none  Narrative Interpretation: EK G unchanged from prior except for rate, he is tachycardic currently, he had received back-to-back albuterol nebs prior to this EKG.  Q waves present prior ekg  Old EKG Reviewed: changes noted  I have personally reviewed the EKG tracing and agree with the computerized printout as noted.  Radiology DG Chest Portable 1 View  Result Date: 04/29/2023 CLINICAL DATA:  Shortness of breath, wheezing EXAM: PORTABLE CHEST 1 VIEW COMPARISON:  04/07/2021 FINDINGS: The heart size and mediastinal contours are within normal limits. Hyperinflated lungs. No focal airspace consolidation, pleural effusion, or pneumothorax. The visualized skeletal structures are unremarkable. IMPRESSION: Hyperinflated lungs. No acute cardiopulmonary findings. Electronically Signed   By: Duanne Guess D.O.   On: 04/29/2023 16:19    Procedures Procedures    Medications Ordered in ED Medications  albuterol (VENTOLIN HFA) 108 (90 Base) MCG/ACT inhaler 2 puff (has no administration in time range)  AeroChamber Plus Flo-Vu Medium MISC 1 each (has no administration in time range)  methylPREDNISolone sodium succinate (SOLU-MEDROL) 125 mg/2 mL injection 125 mg (125 mg Intravenous Given 04/29/23 1432)  ipratropium-albuterol (DUONEB) 0.5-2.5 (3) MG/3ML nebulizer solution 3 mL (3 mLs Nebulization Given 04/29/23 1549)    ED Course/ Medical Decision Making/ A&P                                 Medical Decision Making Patient was given some IV Solu-Medrol and a DuoNeb treatment after which he felt significantly improved with his work of breathing.  He still had a mild expiratory wheeze in his bilateral lower fields but much improved from his initial presentation.  His vital signs remained stable, he was ambulated in  the department and his pulse ox remained above 95%.  He is placed on a pulse dose 5-day prednisone taper.  He is also given albuterol MDI along with a spacer and instructed in its use.  He was given referrals for local clinics for establishing primary care.  We also discussed strict return precautions for any worsening symptoms.  Amount and/or Complexity of Data Reviewed Labs: ordered.    Details: B med and CBC reviewed. Radiology: ordered.    Details: Chest x-ray reviewed and I agree with interpretation, hyperinflated lungs, no evidence of pneumonia. ECG/medicine tests: ordered.  Risk Prescription drug management.           Final Clinical Impression(s) / ED Diagnoses Final diagnoses:  Moderate asthma  with exacerbation, unspecified whether persistent    Rx / DC Orders ED Discharge Orders          Ordered    predniSONE (DELTASONE) 50 MG tablet        04/29/23 1747              Burgess Amor, PA-C 04/29/23 1754    Sloan Leiter, DO 04/29/23 2037

## 2023-04-29 NOTE — Discharge Instructions (Addendum)
Take the entire course of the prednisone prescribed starting tomorrow as you received a dose of this type of medicine through your IV line here today.  Use the inhaler given, 2 puffs every 4 hours if you are wheezing or short of breath.  Use the spacer which will help get the medicine in your lungs where it can be most helpful.  See the suggestions for obtaining a primary medical doctor.  Return here if you develop any new or worsening symptoms.   [facility]  [address] [place], [place] [address] [phone]  Services [facility] offers a variety of basic health services.  Services include but are not limited to: Blood pressure checks  Heart rate checks  Blood sugar checks  Urine analysis  Rapid strep tests  Pregnancy tests.  Health education and referrals  People needing more complex services will be directed to a physician online. Using these virtual visits, doctors can evaluate and prescribe medicine and treatments. There will be no medication on-site, though [facility] will help patients fill their prescriptions at little to no cost.   For More information please go to: [email or web address]

## 2023-04-29 NOTE — ED Triage Notes (Signed)
Pt BIB [facility]. Pt called out for SOB. Hx of asthma and COPD. Chronic smoker. Sats 88% on RA after walking w/ EMS. Pt received 2 albuterol tx by EMS in route.

## 2023-04-29 NOTE — ED Notes (Signed)
Pt sats noted to be 86% on RA while sleeping. Pt now on 2L. Sats 94%

## 2023-04-29 NOTE — ED Notes (Signed)
Ambulatory oxygen on RA 96%.

## 2023-05-12 ENCOUNTER — Emergency Department (HOSPITAL_COMMUNITY)
Admission: EM | Admit: 2023-05-12 | Discharge: 2023-05-12 | Disposition: A | Discharge status: Home / Self Care | Payer: Self-pay | Attending: Emergency Medicine | Admitting: Emergency Medicine

## 2023-05-12 ENCOUNTER — Emergency Department (HOSPITAL_COMMUNITY): Payer: Self-pay

## 2023-05-12 ENCOUNTER — Encounter (HOSPITAL_COMMUNITY): Payer: Self-pay | Admitting: Emergency Medicine

## 2023-05-12 ENCOUNTER — Other Ambulatory Visit: Payer: Self-pay

## 2023-05-12 DIAGNOSIS — F17210 Nicotine dependence, cigarettes, uncomplicated: Secondary | ICD-10-CM | POA: Insufficient documentation

## 2023-05-12 DIAGNOSIS — J45901 Unspecified asthma with (acute) exacerbation: Secondary | ICD-10-CM | POA: Insufficient documentation

## 2023-05-12 MED ORDER — PREDNISONE 20 MG PO TABS: 40.0000 mg | ORAL_TABLET | Freq: Every day | ORAL | 0 refills | Status: AC

## 2023-05-12 MED ORDER — IPRATROPIUM BROMIDE HFA 17 MCG/ACT IN AERS
2.0000 | INHALATION_SPRAY | Freq: Once | RESPIRATORY_TRACT | Status: AC
  Administered 2023-05-12: 2 via RESPIRATORY_TRACT
  Filled 2023-05-12 (×2): qty 12.9

## 2023-05-12 MED ORDER — ALBUTEROL SULFATE HFA 108 (90 BASE) MCG/ACT IN AERS: 2.0000 | INHALATION_SPRAY | RESPIRATORY_TRACT | 1 refills | Status: AC | PRN

## 2023-05-12 NOTE — ED Triage Notes (Signed)
Pt BIB [facility] from home c/o SOB, hx of asthma, out of home inhaler, pt given 7.5 Albuterol, 125 Solumedrol en route, O2 84% RA with bilateral wheezing, VSS

## 2023-05-12 NOTE — ED Provider Notes (Signed)
[facility]-EMERG[facility]Y DEPT [facility] Emergency Department Provider Note MRN:  [id number]  Arrival date & time: 05/12/23     Chief Complaint   Shortness of breath History of Present Illness   Gerald Frank is a 37 y.o. year-old male with a history of asthma presenting to the ED with chief complaint of shortness of breath.  Shortness of breath denies feeling similar to prior episodes of asthma.  Wheezing and tightness.  Feeling much better after EMS interventions.  Did not have any inhalers at home.  Review of Systems  A thorough review of systems was obtained and all systems are negative except as noted in the HPI and PMH.   Patient's Health History    Past Medical History:  Diagnosis Date   Asthma     History reviewed. No pertinent surgical history.  History reviewed. No pertinent family history.  Social History   Socioeconomic History   Marital status: Single    Spouse name: Not on file   Number of children: Not on file   Years of education: Not on file   Highest education level: Not on file  Occupational History   Not on file  Tobacco Use   Smoking status: Every Day    Current packs/day: 1.00    Average packs/day: 1 pack/day for 8.8 years (8.8 ttl pk-yrs)    Types: Cigarettes    Start date: 2016   Smokeless tobacco: Never  Vaping Use   Vaping status: Never Used  Substance and Sexual Activity   Alcohol use: Not Currently   Drug use: Never   Sexual activity: Not on file  Other Topics Concern   Not on file  Social History Narrative   Not on file   Social Determinants of Health   Financial Resource Strain: Not on file  Food Insecurity: Not on file  Transportation Needs: Not on file  Physical Activity: Not on file  Stress: Not on file  Social Connections: Not on file  Intimate Partner Violence: Not on file     Physical Exam   Vitals:   05/12/23 0643 05/12/23 0645  BP:  111/80  Pulse:  91  Resp:  20  Temp:    SpO2: 94% 98%    CONSTITUTIONAL:  Well-appearing, NAD NEURO/PSYCH:  Alert and oriented x 3, no focal deficits EYES:  eyes equal and reactive ENT/NECK:  no LAD, no JVD CARDIO: Regular rate, well-perfused, normal S1 and S2 PULM: Diffuse faint wheezing GI/GU:  non-distended, non-tender MSK/SPINE:  No gross deformities, no edema SKIN:  no rash, atraumatic   *Additional and/or pertinent findings included in MDM below  Diagnostic and Interventional Summary    EKG Interpretation Date/Time:  Thursday May 12 2023 06:01:16 EDT Ventricular Rate:  94 PR Interval:  175 QRS Duration:  86 QT Interval:  362 QTC Calculation: 453 R Axis:   91  Text Interpretation: Sinus rhythm Right atrial enlargement Anterior infarct, old ST elevation, consider inferior injury Confirmed by Kennis Carina [phone]) on 05/12/2023 6:40:29 AM       Labs Reviewed - No data to display  DG Chest Port 1 View    (Results Pending)    Medications  ipratropium (ATROVENT HFA) inhaler 2 puff (2 puffs Inhalation Given 05/12/23 8469)     Procedures  /  Critical Care Procedures  ED Course and Medical Decision Making  Initial Impression and Ddx Seems consistent with asthma exacerbation, screening x-ray to evaluate for pneumothorax, pneumonia, screening EKG.  Seems to be doing much better after EMS  interventions.  Past medical/surgical history that increases complexity of ED encounter: Asthma  Interpretation of Diagnostics I personally reviewed the Chest Xray and my interpretation is as follows: No pneumothorax or lobar opacity, hyperinflated, unchanged from prior  EKG without significant changes, sinus rhythm.  Patient Reassessment and Ultimate Disposition/Management     Patient continues to look and feel well, appropriate for discharge.  Patient management required discussion with the following services or consulting groups:  None  Complexity of Problems Addressed Acute illness or injury that poses threat of life of bodily  function  Additional Data Reviewed and Analyzed Further history obtained from: Prior labs/imaging results  Additional Factors Impacting ED Encounter Risk Prescriptions  Elmer Sow. Pilar Plate, MD [facility] Emergency Medicine [facility] [email or web address]  Final Clinical Impressions(s) / ED Diagnoses     ICD-10-CM   1. Exacerbation of asthma, unspecified asthma severity, unspecified whether persistent  J45.901       ED Discharge Orders          Ordered    albuterol (VENTOLIN HFA) 108 (90 Base) MCG/ACT inhaler  Every 4 hours PRN        05/12/23 0711    predniSONE (DELTASONE) 20 MG tablet  Daily        05/12/23 0711             Discharge Instructions Discussed with and Provided to Patient:     Discharge Instructions      You were evaluated in the Emergency Department and after careful evaluation, we did not find any emergent condition requiring admission or further testing in the hospital.  Your exam/testing today is overall reassuring.  Symptoms likely due to an asthma flareup.  Use the inhaler every 4 hours as needed.  Take the steroids as directed.  Follow-up with your primary care doctor.  Please return to the Emergency Department if you experience any worsening of your condition.   Thank you for allowing [place] to be a part of your care.       Sabas Sous, MD 05/12/23 [phone]

## 2023-05-12 NOTE — Discharge Instructions (Signed)
You were evaluated in the Emergency Department and after careful evaluation, we did not find any emergent condition requiring admission or further testing in the hospital.  Your exam/testing today is overall reassuring.  Symptoms likely due to an asthma flareup.  Use the inhaler every 4 hours as needed.  Take the steroids as directed.  Follow-up with your primary care doctor.  Please return to the Emergency Department if you experience any worsening of your condition.   Thank you for allowing [place] to be a part of your care.

## 2023-05-26 ENCOUNTER — Emergency Department (HOSPITAL_COMMUNITY)
Admission: EM | Admit: 2023-05-26 | Discharge: 2023-05-26 | Disposition: A | Discharge status: Home / Self Care | Payer: Self-pay | Attending: Emergency Medicine | Admitting: Emergency Medicine

## 2023-05-26 DIAGNOSIS — J4541 Moderate persistent asthma with (acute) exacerbation: Secondary | ICD-10-CM | POA: Insufficient documentation

## 2023-05-26 DIAGNOSIS — Z7952 Long term (current) use of systemic steroids: Secondary | ICD-10-CM | POA: Insufficient documentation

## 2023-05-26 DIAGNOSIS — R000 Tachycardia, unspecified: Secondary | ICD-10-CM | POA: Insufficient documentation

## 2023-05-26 DIAGNOSIS — Z7951 Long term (current) use of inhaled steroids: Secondary | ICD-10-CM | POA: Insufficient documentation

## 2023-05-26 DIAGNOSIS — J45901 Unspecified asthma with (acute) exacerbation: Secondary | ICD-10-CM

## 2023-05-26 DIAGNOSIS — F17210 Nicotine dependence, cigarettes, uncomplicated: Secondary | ICD-10-CM | POA: Insufficient documentation

## 2023-05-26 MED ORDER — ALBUTEROL SULFATE HFA 108 (90 BASE) MCG/ACT IN AERS
2.0000 | INHALATION_SPRAY | RESPIRATORY_TRACT | Status: AC
  Administered 2023-05-26: 2 via RESPIRATORY_TRACT
  Filled 2023-05-26: qty 6.7

## 2023-05-26 MED ORDER — PREDNISONE 20 MG PO TABS: 40.0000 mg | ORAL_TABLET | Freq: Every day | ORAL | 0 refills | Status: AC

## 2023-05-26 MED ORDER — AEROCHAMBER Z-STAT PLUS/MEDIUM MISC
1.0000 | Freq: Once | Status: AC
  Administered 2023-05-26: 1
  Filled 2023-05-26: qty 1

## 2023-05-26 MED ORDER — FLUTICASONE-SALMETEROL 115-21 MCG/ACT IN AERO: 2.0000 | INHALATION_SPRAY | Freq: Two times a day (BID) | RESPIRATORY_TRACT | 12 refills | Status: AC

## 2023-05-26 MED ORDER — IPRATROPIUM-ALBUTEROL 0.5-2.5 (3) MG/3ML IN SOLN
3.0000 mL | Freq: Once | RESPIRATORY_TRACT | Status: AC
  Administered 2023-05-26: 3 mL via RESPIRATORY_TRACT
  Filled 2023-05-26: qty 3

## 2023-05-26 NOTE — ED Triage Notes (Signed)
Shortness of breath that started this morning. Hx of copd. Given breathing treatment and 225 solumedrol by ems. Ran out of albuterol at home.

## 2023-05-26 NOTE — ED Provider Notes (Signed)
[place] EMERGENCY DEPARTMENT AT [facility] Provider Note   CSN: [id number] Arrival date & time: 05/26/23  7564     History  Chief Complaint  Patient presents with   Shortness of Breath    Gerald Frank is a 37 y.o. male.  This patient is a very pleasant 37 year old male reporting a primary history of reactive airway disease, states that he has been diagnosed with asthma but he is also a chronic smoker and currently smokes about 1 pack of cigarettes per day.  He had 2 prior visits in the month for asthma related illnesses and both times improved with steroids and albuterol however he states that over the month he has had recurrent symptoms that seem to get worse when he stops using the albuterol and he ran out of his inhaler yesterday.  He reports on a good day he is taking the inhaler 3 times per day but on bad days he uses it nonstop throughout the day.  He denies fevers or chills, he feels like he is coughing but cannot get any mucus up, he denies fevers or chills or swelling of the legs.  He arrives by paramedic transport after he was given albuterol treatments and steroids.  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  The history is provided by the patient.  Shortness of Breath      Home Medications Prior to Admission medications   Medication Sig Start Date End Date Taking? Authorizing Provider  albuterol (VENTOLIN HFA) 108 (90 Base) MCG/ACT inhaler Inhale 2 puffs into the lungs every 4 (four) hours as needed for wheezing or shortness of breath. 05/12/23  Yes Sabas Sous, MD  fluticasone-salmeterol (ADVAIR HFA) [phone] MCG/ACT inhaler Inhale 2 puffs into the lungs 2 (two) times daily. 05/26/23  Yes Eber Hong, MD   predniSONE (DELTASONE) 20 MG tablet Take 2 tablets (40 mg total) by mouth daily. 05/26/23  Yes Eber Hong, MD      Allergies    Patient has no known allergies.    Review of Systems   Review of Systems  Respiratory:  Positive for shortness of breath.   All other systems reviewed and are negative.   Physical Exam Updated Vital Signs BP (!) 120/94 (BP Location: Left Arm)   Pulse (!) 102   Temp 98.8 F (37.1 C) (Oral)   Resp 20   Ht 1.854 m (6\' 1" )   Wt 72 kg   SpO2 96%   BMI 20.94 kg/m  Physical Exam Vitals and nursing note reviewed.  Constitutional:      General: He is not in acute distress.    Appearance: He is well-developed.  HENT:     Head: Normocephalic and atraumatic.     Mouth/Throat:     Pharynx: No oropharyngeal exudate.  Eyes:     General: No scleral icterus.       Right eye: No discharge.        Left eye: No discharge.     Conjunctiva/sclera: Conjunctivae normal.     Pupils: Pupils are equal, round, and reactive to light.  Neck:     Thyroid: No thyromegaly.     Vascular: No JVD.  Cardiovascular:     Rate and Rhythm: Regular rhythm. Tachycardia present.  Heart sounds: Normal heart sounds. No murmur heard.    No friction rub. No gallop.     Comments: Tachycardic to 105 bpm with normal pulses at the radial arteries, no peripheral edema and no JVD Pulmonary:     Effort: No respiratory distress.     Breath sounds: Wheezing present. No rales.     Comments: Mild tachypnea, mild prolonged expiratory phase, diffuse expiratory wheezing, no rales, speaks in just shortened sentences Abdominal:     General: Bowel sounds are normal. There is no distension.     Palpations: Abdomen is soft. There is no mass.     Tenderness: There is no abdominal tenderness.  Musculoskeletal:        General: No tenderness. Normal range of motion.     Cervical back: Normal range of motion and neck supple.     Right lower leg: No edema.     Left lower leg: No edema.   Lymphadenopathy:     Cervical: No cervical adenopathy.  Skin:    General: Skin is warm and dry.     Findings: No erythema or rash.  Neurological:     Mental Status: He is alert.     Coordination: Coordination normal.  Psychiatric:        Behavior: Behavior normal.     ED Results / Procedures / Treatments   Labs (all labs ordered are listed, but only abnormal results are displayed) Labs Reviewed - No data to display  EKG None  Radiology No results found.  Procedures Procedures    Medications Ordered in ED Medications  albuterol (VENTOLIN HFA) 108 (90 Base) MCG/ACT inhaler 2 puff (has no administration in time range)  aerochamber Z-Stat Plus/medium 1 each (has no administration in time range)  ipratropium-albuterol (DUONEB) 0.5-2.5 (3) MG/3ML nebulizer solution 3 mL (3 mLs Nebulization Given 05/26/23 0808)  ipratropium-albuterol (DUONEB) 0.5-2.5 (3) MG/3ML nebulizer solution 3 mL (3 mLs Nebulization Given 05/26/23 7829)    ED Course/ Medical Decision Making/ A&P                                 Medical Decision Making Risk Prescription drug management.    This patient presents to the ED for concern of shortness of breath, this involves an extensive number of treatment options, and is a complaint that carries with it a high risk of complications and morbidity.  The differential diagnosis includes COPD, asthma, seems less likely to be pneumonia given no fevers, no phlegm, chronic symptoms over the month.  Could be related to pneumothorax but has equal lung sounds bilaterally and denies chest pain to this examiner.  The mild chest pain that he did have was a heaviness or tightness which is improved by paramedic albuterol treatment and is not lateralizing   Co morbidities that complicate the patient evaluation  Ongoing tobacco use and prior lung disease   Additional history obtained:  Additional history obtained from medical record External records from outside  source obtained and reviewed including multiple prior ER visits, x-rays from the past have been reviewed as well, no pneumothorax   Lab Tests:  I Ordered, and personally interpreted labs.  The pertinent results include: Not indicated    Cardiac Monitoring: / EKG:  The patient was maintained on a cardiac monitor.  I personally viewed and interpreted the cardiac monitored which showed an underlying rhythm of: Mild sinus tachycardia    Problem List / ED Course /  Critical interventions / Medication management  Patient will be given multiple treatments including DuoNebs, he was already given steroids, he will need to improve prior to being considered for discharge.  He does have fairly significant asthma and recent exacerbations, he does not have medical insurance to help with medications, he does not have a family doctor, he will be given referral to the [facility] department and pulmonology as I think he would benefit from a formal pulmonology evaluation. I ordered medication including DuoNeb x 2 for wheezing Reevaluation of the patient after these medicines showed that the patient and I have reviewed the patients home medicines and have made adjustments as needed   Social Determinants of Health:  Ongoing tobacco and marijuana use   Test / Admission - Considered:  Improved significantly - back to normal. Pt amenable to d/c with advair / albuterol Prednisone Pulm referral given         Final Clinical Impression(s) / ED Diagnoses Final diagnoses:  Moderate asthma with exacerbation, unspecified whether persistent    Rx / DC Orders ED Discharge Orders          Ordered    predniSONE (DELTASONE) 20 MG tablet  Daily        05/26/23 1004    fluticasone-salmeterol (ADVAIR HFA) 115-21 MCG/ACT inhaler  2 times daily        05/26/23 1005              Eber Hong, MD 05/26/23 1007

## 2023-05-26 NOTE — Discharge Instructions (Signed)
Start taking the Advair twice a day, I would also like for you to start using the prednisone once a day for 5 days and you will need to see a pulmonologist, this is a lung specialist, please see the phone number above for Dr. Sherene Sires.  If you do not have a family doctor see below, you do need to go to the [facility] office and apply for Medicaid to help with your insurance which will help you with your medications  [facility] Doctor List    Syliva Overman, MD. Specialty: [facility] Medicine Contact information: [address]  [place] [address]  [phone]   Lilyan Punt, MD. Specialty: Family Medicine Contact information: [address]  [place] [address]  [phone]   Avon Gully, MD Specialty: Internal Medicine Contact information: [address] [place] [address]  [phone]   Catalina Pizza, MD. Specialty: Internal Medicine Contact information: [address]  [place] [address]  [phone]    [facility] (Dr. Selena Batten) Specialty: Family Medicine Contact information: [address]  [place] [address]  [phone]   Carylon Perches, MD. Specialty: Internal Medicine Contact information: [address]  [address]  [place] [address]  [phone]   [facility]  [address] [place], [place] [address] [phone]  Services [facility] offers a variety of basic health services.  People needing more complex services will be directed to a physician online. Using these virtual visits, doctors can evaluate and prescribe medicine and treatments. There will be no medication on-site, though [facility] will help patients fill their prescriptions at little to no cost.   [facility] [address] [place] [address]  [phone]     [facility]  [facility] [address] [place] [address]  [phone]   For More information please go to: [email or web address]
